# Patient Record
Sex: Male | Born: 1970
Health system: Western US, Academic
[De-identification: ages and names within clinical notes are randomized; demographics above are authoritative.]

---

## 2006-12-12 ENCOUNTER — Encounter (HOSPITAL_BASED_OUTPATIENT_CLINIC_OR_DEPARTMENT_OTHER): Payer: PPO | Admitting: Gastroenterology

## 2006-12-26 ENCOUNTER — Encounter (HOSPITAL_BASED_OUTPATIENT_CLINIC_OR_DEPARTMENT_OTHER): Payer: PPO

## 2019-11-13 ENCOUNTER — Encounter (HOSPITAL_COMMUNITY): Payer: Self-pay

## 2019-11-14 NOTE — Historical Transplant Communications (Signed)
Cassie, Henkels ([id number])  Transplant Communications  ____________________________________________________________________________________    Date: 06/23/2007  Type: Action  Entry By: Kathrin Penner to sched Hep appt.  ____________________________________________________________________________________    Date: 06/11/2007  Type: Referral  Entry By: Beecher Mcardle transfer referral processed, delivered to [facility].  Blood work needed prior to appt.
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
## 2023-04-03 IMAGING — MR MRI ABDOMEN W/WO CONTRAST
13 of 19 series · 23 of 48 positions shown · IV contrast (gadavist)
Comparison: None

________________________________________________________________________________________________ 
MRI ABDOMEN W/WO CONTRAST, 04/03/2023 [DATE]: 
CLINICAL INDICATION: Hereditary hemachromatosis
TECHNIQUE: Multiplanar, multiecho position MR images of the abdomen were 
performed without and with intravenous enhancement. 10.5 mL of Gadavist were 
injected intravenously by hand. 4.5 mL of Gadavist discarded. Patient was 
scanned on a 1.5T magnet.

[Series 101: survey-head 1st · axial · 15.0mm · 1.76mm/px · 1 of 15 slices shown]
[im 1/15]
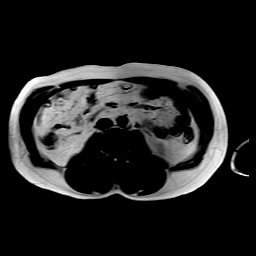

[Series 201: T2 · coronal · 5.0mm · 0.83mm/px · 1 of 48 slices shown]
[im 1/48]
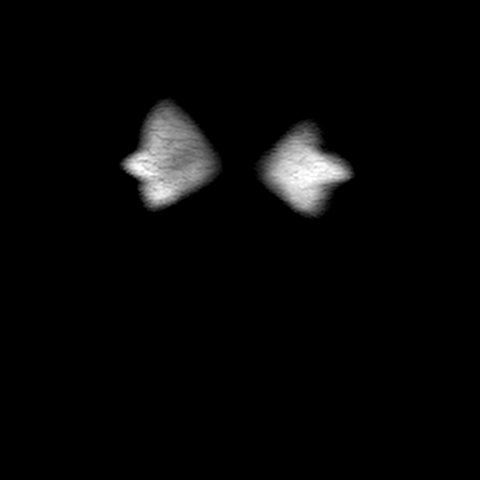

[Series 302: sout of phase · axial · 6.0mm · 1.14mm/px · 1 of 39 slices shown]
[im 1/39]
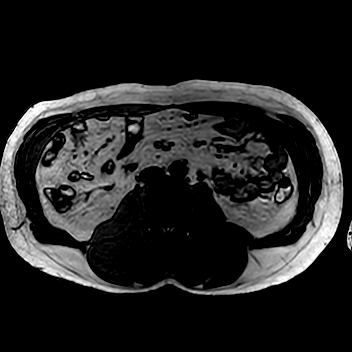

[Series 303: sin phase · axial · 6.0mm · 1.14mm/px · 1 of 39 slices shown]
[im 1/39]
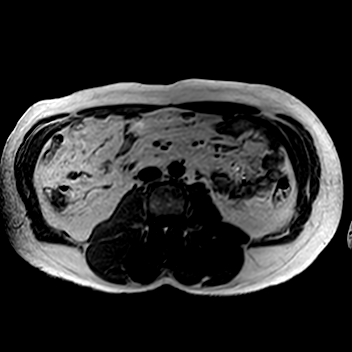

[Series 401: t2_ax_mvxd_hr_rt · axial · 5.0mm · 0.74mm/px · 1 of 43 slices shown]
[im 1/43]
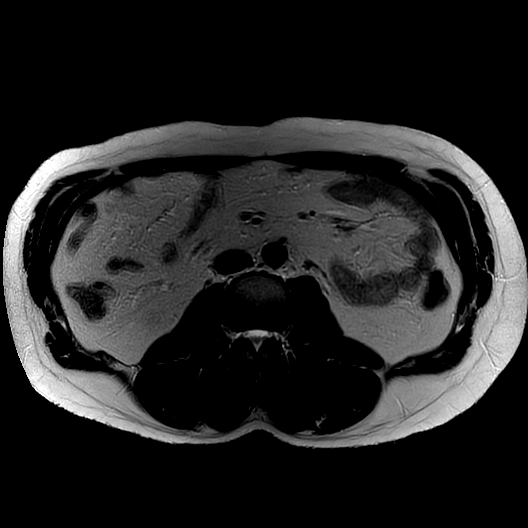

[Series 501: t2_spair mvxd_rt_fast · axial · 5.0mm · 0.81mm/px · 1 of 43 slices shown]
[im 1/43]
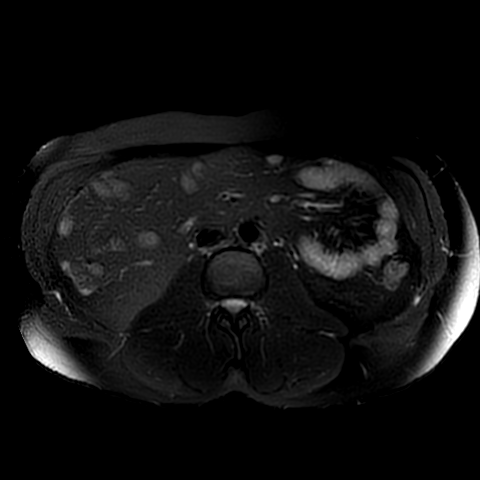

[Series 602: sbo · axial · 5.0mm · 1.76mm/px · 1 of 50 slices shown]
[im 1/50]
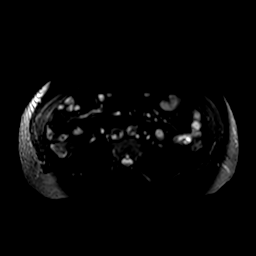

[Series 603: (id) · axial · 5.0mm · 1.76mm/px · 1 of 50 slices shown]
[im 1/50]
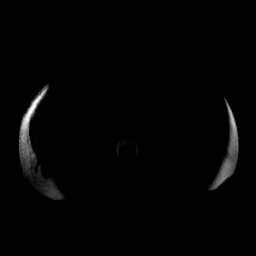

[Series 604: dadc 600 · axial · 5.0mm · 1.76mm/px · 1 of 50 slices shown]
[im 1/50]
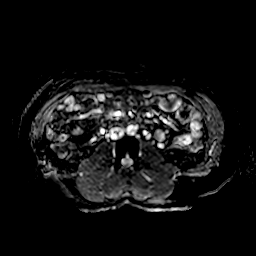

[Series 702: DIXON · axial · 4.0mm · 0.78mm/px · z∈[-63,+207]mm · 3 of 136 slices shown (1 of 4)]
[im 1/136]
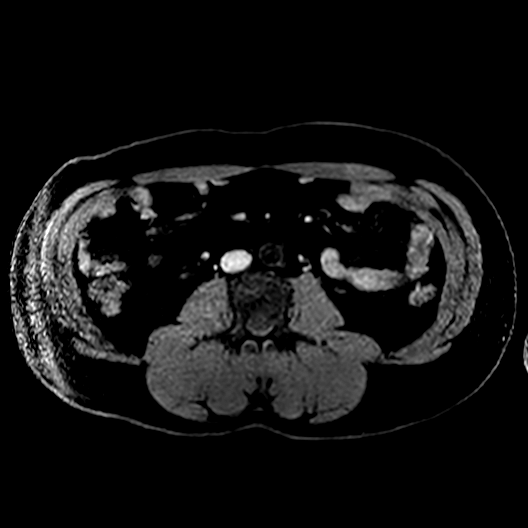
[im 68/136]
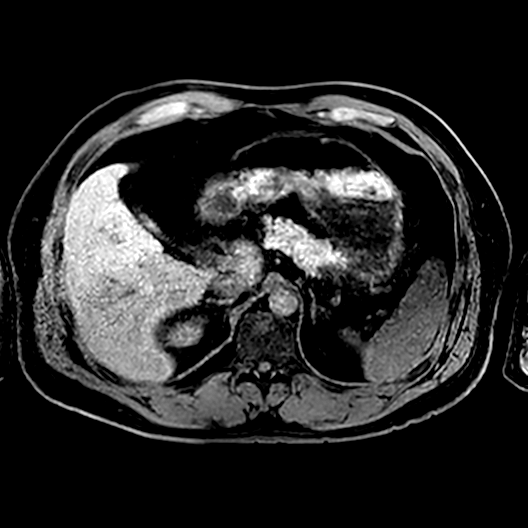
[im 136/136]
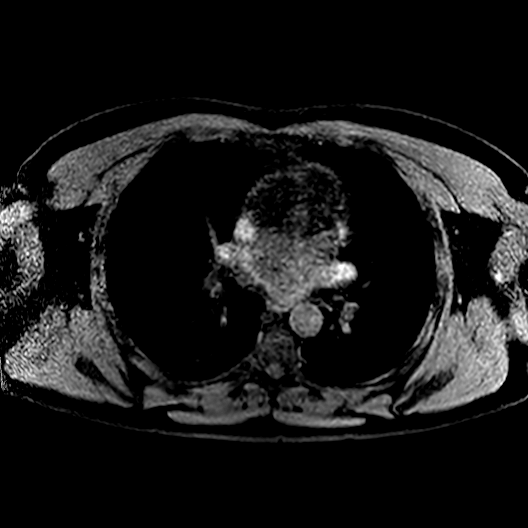

[Series 703: DIXON · axial · 4.0mm · 0.78mm/px · z∈[-63,+207]mm · 4 of 136 slices shown (2 of 4)]
[im 1/136]
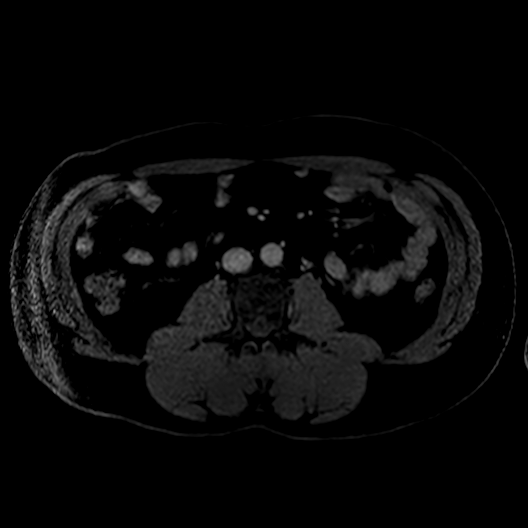
[im 46/136]
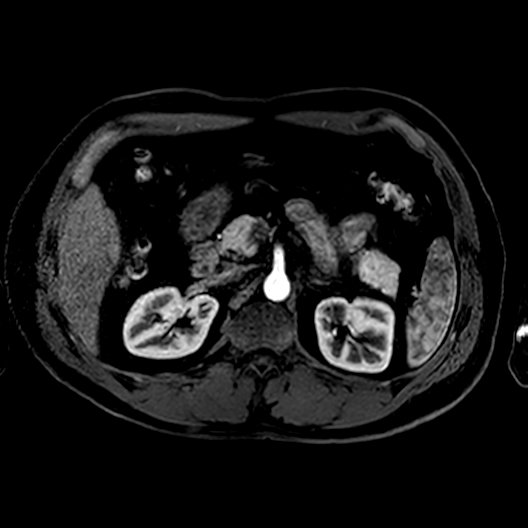
[im 91/136]
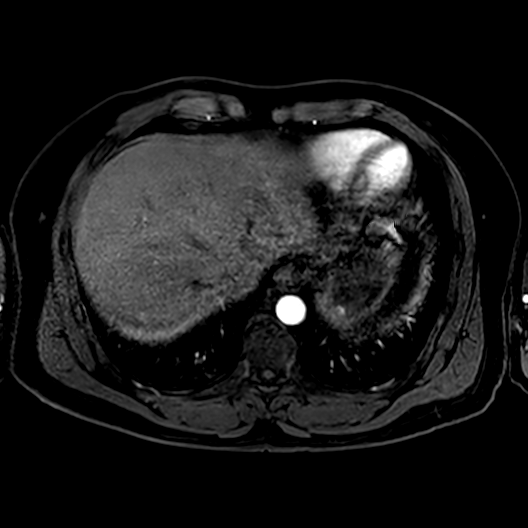
[im 136/136]
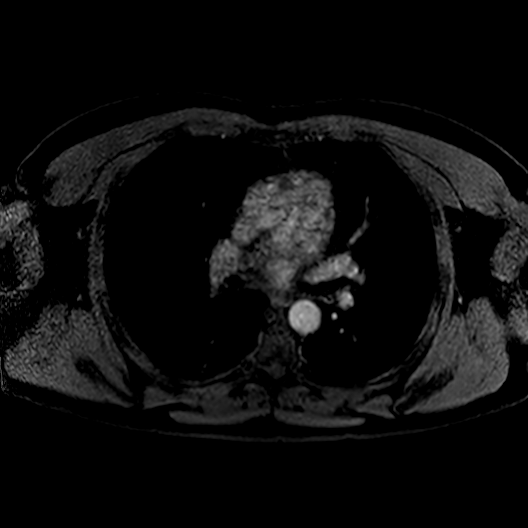

[Series 704: DIXON · axial · 4.0mm · 0.78mm/px · z∈[-63,+207]mm · 4 of 136 slices shown (3 of 4)]
[im 1/136]
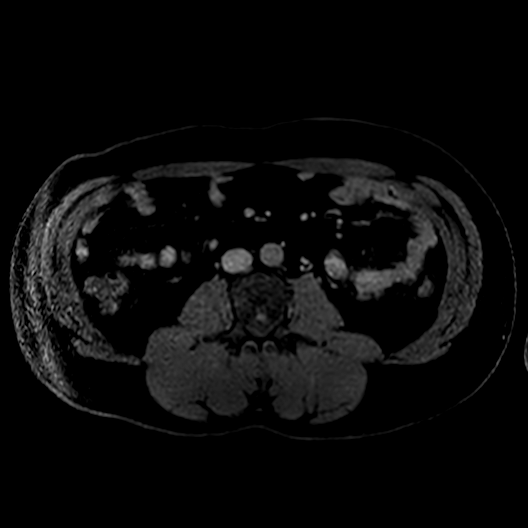
[im 46/136]
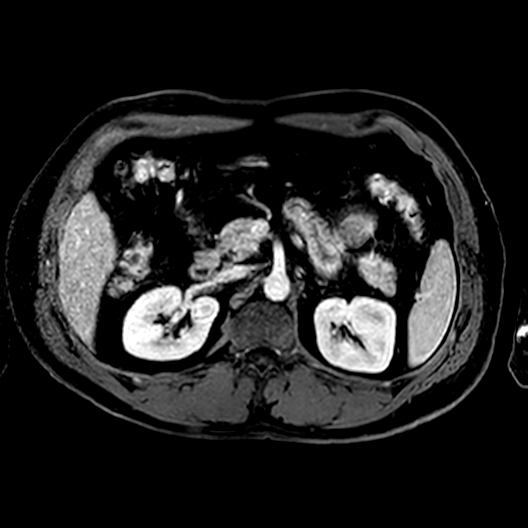
[im 91/136]
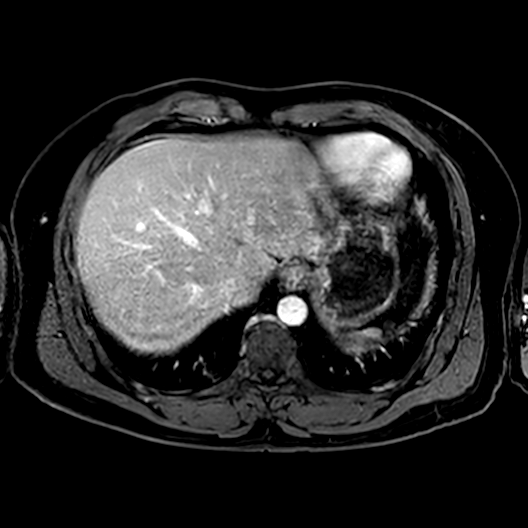
[im 136/136]
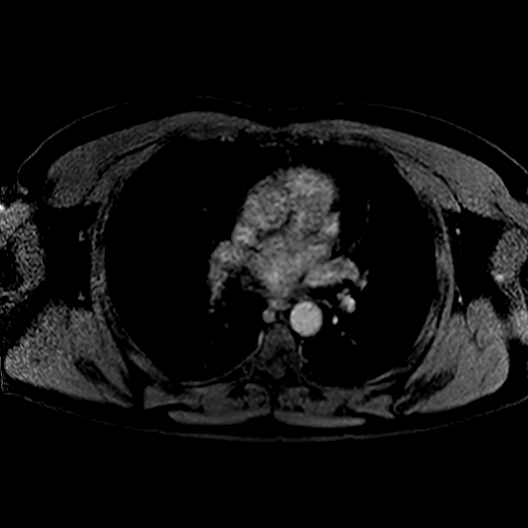

[Series 705: DIXON · axial · 4.0mm · 0.78mm/px · z∈[-63,+117]mm · 3 of 136 slices shown (4 of 4)]
[im 1/136]
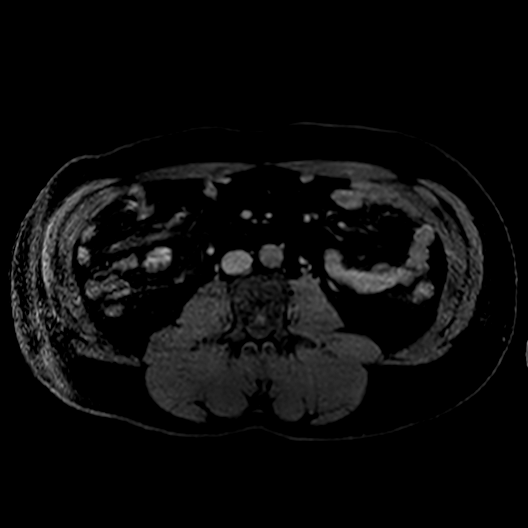
[im 46/136]
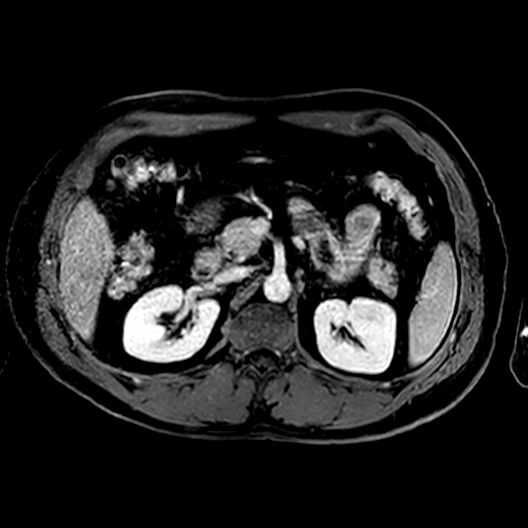
[im 91/136]
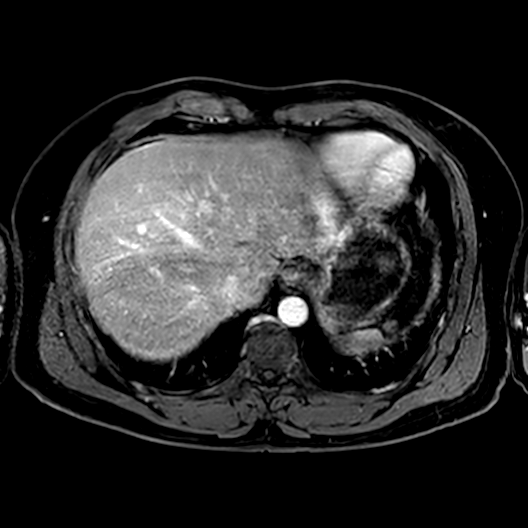

[23 of 48 positions shown; findings below may reference images not displayed]

FINDINGS: The liver is normal in appearance. Specifically, there is no 
significant decrease signal on T2-weighted images. There is no reversal of the 
appearance on in and out of phase imaging to suggest significant iron 
deposition. There is no findings suspicious for malignancy. No abnormal 
enhancement. Portal and hepatic veins are normal in appearance. The only finding 
regarding the liver is the contracted gallbladder. Significance uncertain. 
Gallbladder wall does enhance though it is contracted. 
Pancreas, spleen, adrenal glands and kidneys are unremarkable in appearance. 
Simple cyst seen on the left. 
No lymphadenopathy identified.
IMPRESSION: Contracted gallbladder. Otherwise unremarkable exam. Please see discussion 
above.
